# Patient Record
Sex: Female | Born: 1995 | Race: White | Hispanic: No | Marital: Single | Smoking: Never smoker
Health system: Southern US, Community
[De-identification: ages and names within clinical notes are randomized; demographics above are authoritative.]

## PROBLEM LIST (undated history)

## (undated) DIAGNOSIS — M419 Scoliosis, unspecified: Secondary | ICD-10-CM

## (undated) DIAGNOSIS — F329 Major depressive disorder, single episode, unspecified: Secondary | ICD-10-CM

## (undated) DIAGNOSIS — F32A Depression, unspecified: Secondary | ICD-10-CM

## (undated) DIAGNOSIS — F419 Anxiety disorder, unspecified: Secondary | ICD-10-CM

## (undated) HISTORY — DX: Scoliosis, unspecified: M41.9

## (undated) HISTORY — DX: Anxiety disorder, unspecified: F41.9

## (undated) HISTORY — PX: APPENDECTOMY: SHX54

## (undated) HISTORY — DX: Depression, unspecified: F32.A

## (undated) HISTORY — DX: Major depressive disorder, single episode, unspecified: F32.9

---
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
## 2016-01-12 ENCOUNTER — Other Ambulatory Visit: Payer: Self-pay | Admitting: Gynecology

## 2016-02-08 ENCOUNTER — Other Ambulatory Visit: Payer: Self-pay | Admitting: Gynecology

## 2016-02-15 ENCOUNTER — Other Ambulatory Visit: Payer: Self-pay | Admitting: Gynecology

## 2016-04-03 ENCOUNTER — Encounter: Payer: 59 | Admitting: Gynecology

## 2016-04-06 ENCOUNTER — Other Ambulatory Visit: Payer: Self-pay | Admitting: Gynecology

## 2016-04-07 NOTE — Telephone Encounter (Signed)
appointment on 04/08/16

## 2016-04-08 ENCOUNTER — Encounter: Payer: Self-pay | Admitting: Gynecology

## 2016-04-08 ENCOUNTER — Ambulatory Visit (INDEPENDENT_AMBULATORY_CARE_PROVIDER_SITE_OTHER): Payer: 59 | Admitting: Gynecology

## 2016-04-08 VITALS — BP 118/74 | Ht 64.0 in | Wt 147.0 lb

## 2016-04-08 DIAGNOSIS — Z01419 Encounter for gynecological examination (general) (routine) without abnormal findings: Secondary | ICD-10-CM

## 2016-04-08 MED ORDER — NORETHIN ACE-ETH ESTRAD-FE 1-20 MG-MCG PO TABS: 1.0000 | ORAL_TABLET | Freq: Every day | ORAL | 4 refills | Status: DC

## 2016-04-08 NOTE — Patient Instructions (Signed)

## 2016-04-08 NOTE — Progress Notes (Signed)
    Kristin George 05/03/1996 [id number]        20 y.o.  G0P0  for annual exam.  Doing well without complaints.  Past medical history,surgical history, problem list, medications, allergies, family history and social history were all reviewed and documented as reviewed in the EPIC chart.  ROS:  Performed with pertinent positives and negatives included in the history, assessment and plan.   Additional significant findings :  None   Exam: Kennon PortelaKim Gardner assistant Vitals:   04/08/16 1203  BP: 118/74  Weight: 147 lb (66.7 kg)  Height: 5\' 4"  (1.626 m)   Body mass index is 25.23 kg/m.  General appearance:  Normal affect, orientation and appearance. Skin: Grossly normal HEENT: Without gross lesions.  No cervical or supraclavicular adenopathy. Thyroid normal.  Lungs:  Clear without wheezing, rales or rhonchi Cardiac: RR, without RMG Abdominal:  Soft, nontender, without masses, guarding, rebound, organomegaly or hernia Breasts:  Examined lying and sitting without masses, retractions, discharge or axillary adenopathy. Pelvic:  Ext, BUS, Vagina normal  Cervix normal  Uterus anteverted, normal size, shape and contour, midline and mobile nontender   Adnexa without masses or tenderness    Anus and perineum normal     Assessment/Plan:  20 y.o. G0P0 female for annual exam with regular menses doing it every other month withdrawal..   1. Oral contraceptives. Patient having a good sponsor Loestrin 1/20 menstrual suppression. Doing an every other month withdrawal but asked about doing an every three-month. Issues of breakthrough bleeding discussed. Patient wants to try. 3 packs prescribed with 4 refills. 2. Virginal status. Reviewed need for backup contraception both from an STD standpoint with condoms as well as from a contraceptive standpoint. Reviewed decreased efficacy of her pills with certain medications. Does not plan to become sexually active anytime soon. 3. Pap smear deferred until age 20  per current screening guidelines 4. STD screening not indicated due to virginal status 5. Gardasil series received 6. Breast health. SBE monthly reviewed. 7. Health maintenance. No routine lab work drawn. Patient without signs or symptoms to suggest need. Follow up in one year, sooner as needed.   Dara LordsFONTAINE,Kristin George P MD, 12:30 PM 04/08/2016

## 2017-04-20 ENCOUNTER — Other Ambulatory Visit: Payer: Self-pay | Admitting: Gynecology

## 2018-07-01 ENCOUNTER — Ambulatory Visit: Payer: 59 | Admitting: Gynecology

## 2018-07-01 ENCOUNTER — Encounter: Payer: Self-pay | Admitting: Gynecology

## 2018-07-01 VITALS — BP 138/76 | Ht 64.75 in | Wt 170.2 lb

## 2018-07-01 DIAGNOSIS — Z01419 Encounter for gynecological examination (general) (routine) without abnormal findings: Secondary | ICD-10-CM

## 2018-07-01 MED ORDER — NORETHIN ACE-ETH ESTRAD-FE 1-20 MG-MCG PO TABS: 1.0000 | ORAL_TABLET | Freq: Every day | ORAL | 4 refills | Status: AC

## 2018-07-01 NOTE — Addendum Note (Signed)
Addended by: Richardson ChiquitoWILKINSON, Hideko Esselman S on: 07/01/2018 03:23 PM   Modules accepted: Orders

## 2018-07-01 NOTE — Patient Instructions (Signed)
Follow-up in 1 year for annual exam, sooner if any issues. 

## 2018-07-01 NOTE — Progress Notes (Signed)
    Kristin George [birth year] [id number]        22 y.o.  G0P0 for annual gynecologic exam.  Doing well without gynecologic complaints.  Past medical history,surgical history, problem list, medications, allergies, family history and social history were all reviewed and documented as reviewed in the EPIC chart.  ROS:  Performed with pertinent positives and negatives included in the history, assessment and plan.   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  General appearance:  Normal affect, orientation and appearance. Skin: Grossly normal HEENT: Without gross lesions.  No cervical or supraclavicular adenopathy. Thyroid normal.  Lungs:  Clear without wheezing, rales or rhonchi Cardiac: RR, without RMG Abdominal:  Soft, nontender, without masses, guarding, rebound, organomegaly or hernia Breasts:  Examined lying and sitting without masses, retractions, discharge or axillary adenopathy. Pelvic:  Ext, BUS, Vagina: Normal  Cervix: Normal.  Pap smear done  Uterus: Anteverted, normal size, shape and contour, midline and mobile nontender   Adnexa: Without masses or tenderness    Anus and perineum: Normal   Assessment/Plan:  22 y.o. G0P0 female for annual gynecologic exam with regular menses, oral contraceptives.   1. Oral contraceptives.  Using for menstrual regulation.  Remains virginal.  1/20 equivalent prescribed x1 year. 2. STD screening not indicated as she remains virginal. 3. Gardasil series received 4. Pap smear done today as she has turned 22. 5. Breast health.  Breast exam normal today.  SBE monthly reviewed. 6. Health maintenance.  No routine labs ordered as patient without signs or symptoms to warrant screening at this time.   Dara Lordsimothy P Tamura Lasky MD, 3:04 PM 07/01/2018

## 2018-07-02 LAB — PAP IG W/ RFLX HPV ASCU

## 2019-03-28 ENCOUNTER — Encounter: Payer: Self-pay | Admitting: Gynecology

## 2019-07-05 ENCOUNTER — Other Ambulatory Visit: Payer: Self-pay
